# Patient Record
Sex: Female | Born: 1995 | Race: Black or African American | Hispanic: No | Marital: Single | Smoking: Never smoker
Health system: Southern US, Community
[De-identification: ages and names within clinical notes are randomized; demographics above are authoritative.]

---
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
## 2016-05-03 ENCOUNTER — Emergency Department (HOSPITAL_COMMUNITY)
Admission: EM | Admit: 2016-05-03 | Discharge: 2016-05-03 | Disposition: A | Discharge status: Home / Self Care | Payer: No Typology Code available for payment source | Attending: Emergency Medicine | Admitting: Emergency Medicine

## 2016-05-03 ENCOUNTER — Encounter (HOSPITAL_COMMUNITY): Payer: Self-pay | Admitting: *Deleted

## 2016-05-03 DIAGNOSIS — R05 Cough: Secondary | ICD-10-CM | POA: Diagnosis present

## 2016-05-03 DIAGNOSIS — J069 Acute upper respiratory infection, unspecified: Secondary | ICD-10-CM | POA: Insufficient documentation

## 2016-05-03 LAB — RAPID STREP SCREEN (MED CTR MEBANE ONLY): Streptococcus, Group A Screen (Direct): NEGATIVE

## 2016-05-03 NOTE — ED Provider Notes (Signed)
[facility] Provider Note   CSN: [id number] Arrival date & time: 05/03/16  2113     History   Chief Complaint Chief Complaint  Patient presents with  . Cough  . Sore Throat  . Chest Pain    HPI Kelly Lynn is a 21 y.o. female.  21 year old female presents with 48 hour history of URI symptoms consisting of sore throat, chest congestion, subjective myalgias. Denies any vomiting or diarrhea. No headache or neck pain. No photophobia. No rashes noted. Notes positive sick exposures. Has used of the counter medications without relief.      History reviewed. No pertinent past medical history.  There are no active problems to display for this patient.   History reviewed. No pertinent surgical history.  OB History    No data available       Home Medications    Prior to Admission medications   Medication Sig Start Date End Date Taking? Authorizing Provider  ferrous sulfate 324 (65 Fe) MG TBEC Take 1 tablet (324 mg total) by mouth daily. 03/29/15  Yes Raliegh Ip, DO    Family History No family history on file.  Social History Social History  Substance Use Topics  . Smoking status: Never Smoker  . Smokeless tobacco: Never Used  . Alcohol use No     Allergies   Patient has no known allergies.   Review of Systems Review of Systems  All other systems reviewed and are negative.    Physical Exam Updated Vital Signs BP 110/79 (BP Location: Left Arm)   Pulse (!) 105   Temp 98.6 F (37 C) (Oral)   Resp 16   SpO2 100%   Physical Exam  Constitutional: She is oriented to person, place, and time. She appears well-developed and well-nourished.  Non-toxic appearance. No distress.  HENT:  Head: Normocephalic and atraumatic.  Eyes: Conjunctivae, EOM and lids are normal. Pupils are equal, round, and reactive to light.  Neck: Normal range of motion. Neck supple. No tracheal deviation present. No thyroid mass present.  Cardiovascular: Normal rate,  regular rhythm and normal heart sounds.  Exam reveals no gallop.   No murmur heard. Pulmonary/Chest: Effort normal and breath sounds normal. No stridor. No respiratory distress. She has no decreased breath sounds. She has no wheezes. She has no rhonchi. She has no rales.  Abdominal: Soft. Normal appearance and bowel sounds are normal. She exhibits no distension. There is no tenderness. There is no rebound and no CVA tenderness.  Musculoskeletal: Normal range of motion. She exhibits no edema or tenderness.  Neurological: She is alert and oriented to person, place, and time. She has normal strength. No cranial nerve deficit or sensory deficit. GCS eye subscore is 4. GCS verbal subscore is 5. GCS motor subscore is 6.  Skin: Skin is warm and dry. No abrasion and no rash noted.  Psychiatric: She has a normal mood and affect. Her speech is normal and behavior is normal.  Nursing note and vitals reviewed.    ED Treatments / Results  Labs (all labs ordered are listed, but only abnormal results are displayed) Labs Reviewed  RAPID STREP SCREEN (NOT AT [facility])  CULTURE, GROUP A STREP [facility])    EKG  EKG Interpretation  Date/Time:  Thursday May 03 2016 21:38:24 EDT Ventricular Rate:  101 PR Interval:    QRS Duration: 88 QT Interval:  338 QTC Calculation: 439 R Axis:   22 Text Interpretation:  Age not entered, assumed to be  21 years  old for purpose of ECG interpretation Sinus tachycardia Abnormal R-wave progression, early transition Confirmed by Potts  MD, Gwynneth Fabio ([id number]) on 05/03/2016 10:31:23 PM       Radiology No results found.  Procedures Procedures (including critical care time)  Medications Ordered in ED Medications - No data to display   Initial Impression / Assessment and Plan / ED Course  I have reviewed the triage vital signs and the nursing notes.  Pertinent labs & imaging results that were available during my care of the patient were reviewed by me and considered in my  medical decision making (see chart for details).     Strep test is negative. Patient likely URI. Stable for discharge  Final Clinical Impressions(s) / ED Diagnoses   Final diagnoses:  None    New Prescriptions New Prescriptions   No medications on file     Lorre NickAnthony Lunt, MD 05/03/16 2241

## 2016-05-03 NOTE — ED Triage Notes (Signed)
Pt complains of sore throat, cough, and chest pain for the past couple of days. Pt states the pain is worse with cough and talking. Pt states she noticed some blood in her sputum this morning.

## 2016-05-06 LAB — CULTURE, GROUP A STREP (THRC)

## 2016-12-17 ENCOUNTER — Ambulatory Visit (INDEPENDENT_AMBULATORY_CARE_PROVIDER_SITE_OTHER): Payer: Self-pay

## 2016-12-17 ENCOUNTER — Ambulatory Visit (HOSPITAL_COMMUNITY)
Admission: EM | Admit: 2016-12-17 | Discharge: 2016-12-17 | Disposition: A | Discharge status: Home / Self Care | Payer: Self-pay | Attending: Emergency Medicine | Admitting: Emergency Medicine

## 2016-12-17 ENCOUNTER — Encounter (HOSPITAL_COMMUNITY): Payer: Self-pay | Admitting: Emergency Medicine

## 2016-12-17 DIAGNOSIS — S63501A Unspecified sprain of right wrist, initial encounter: Secondary | ICD-10-CM

## 2016-12-17 DIAGNOSIS — M25531 Pain in right wrist: Secondary | ICD-10-CM

## 2016-12-17 NOTE — ED Provider Notes (Signed)
[facility]    CSN: [id number] Arrival date & time: 12/17/16  1620     History   Chief Complaint Chief Complaint  Patient presents with  . Wrist Pain    HPI Kelly Lynn is a 21 y.o. female.   21 year-old female, presenting today complaining of right wrist pain. She states that she was opening a door a few days ago with a flexed wrist and and has had pain to the radial aspect of the right wrist since that time with some mild swelling. Patient is right handed. She has been using ice and motrin at home with mild relief    The history is provided by the patient.  Wrist Pain  This is a new problem. The current episode started more than 2 days ago. The problem occurs constantly. The problem has not changed since onset.Pertinent negatives include no chest pain, no abdominal pain, no headaches and no shortness of breath. Exacerbated by: flexion of the wrist  The symptoms are relieved by ice and NSAIDs. She has tried a cold compress for the symptoms. The treatment provided mild relief.    History reviewed. No pertinent past medical history.  There are no active problems to display for this patient.   History reviewed. No pertinent surgical history.  OB History    No data available       Home Medications    Prior to Admission medications   Medication Sig Start Date End Date Taking? Authorizing Provider  ferrous sulfate 324 (65 Fe) MG TBEC Take 1 tablet (324 mg total) by mouth daily. 03/29/15   Raliegh Ip, DO    Family History History reviewed. No pertinent family history.  Social History Social History  Substance Use Topics  . Smoking status: Never Smoker  . Smokeless tobacco: Never Used  . Alcohol use No     Allergies   Patient has no known allergies.   Review of Systems Review of Systems  Constitutional: Negative for chills and fever.  HENT: Negative for ear pain and sore throat.   Eyes: Negative for pain and visual disturbance.    Respiratory: Negative for cough and shortness of breath.   Cardiovascular: Negative for chest pain and palpitations.  Gastrointestinal: Negative for abdominal pain and vomiting.  Genitourinary: Negative for dysuria and hematuria.  Musculoskeletal: Positive for arthralgias (right wrist ) and joint swelling. Negative for back pain.  Skin: Negative for color change and rash.  Neurological: Negative for seizures, syncope and headaches.  All other systems reviewed and are negative.    Physical Exam Triage Vital Signs ED Triage Vitals [12/17/16 1710]  Enc Vitals Group     BP 127/62     Pulse Rate 82     Resp 18     Temp 98.4 F (36.9 C)     Temp Source Oral     SpO2 100 %     Weight      Height      Head Circumference      Peak Flow      Pain Score 7     Pain Loc      Pain Edu?      Excl. in GC?    No data found.   Updated Vital Signs BP 127/62 (BP Location: Right Arm)   Pulse 82   Temp 98.4 F (36.9 C) (Oral)   Resp 18   SpO2 100%   Visual Acuity Right Eye Distance:   Left Eye Distance:  Bilateral Distance:    Right Eye Near:   Left Eye Near:    Bilateral Near:     Physical Exam  Constitutional: She appears well-developed and well-nourished. No distress.  HENT:  Head: Normocephalic and atraumatic.  Eyes: Conjunctivae are normal.  Neck: Neck supple.  Cardiovascular: Normal rate and regular rhythm.   No murmur heard. Pulmonary/Chest: Effort normal and breath sounds normal. No respiratory distress.  Abdominal: Soft. There is no tenderness.  Musculoskeletal: She exhibits no edema.       Right wrist: She exhibits tenderness and swelling.  Tenderness to palpation of the right wrist with mild amount of swelling noted to the radial aspect of the wrist.   Neurological: She is alert.  Skin: Skin is warm and dry.  Psychiatric: She has a normal mood and affect.  Nursing note and vitals reviewed.    UC Treatments / Results  Labs (all labs ordered are listed,  but only abnormal results are displayed) Labs Reviewed - No data to display  EKG  EKG Interpretation None       Radiology Dg Wrist Complete Right  Result Date: 12/17/2016 CLINICAL DATA:  Patient states that she hyperextended her right wrist when she fell two days ago, pain. EXAM: RIGHT WRIST - COMPLETE 3+ VIEW COMPARISON:  None. FINDINGS: No distal radius or ulnar fracture. Radiocarpal joint is intact. No carpal fracture. No soft tissue abnormality. IMPRESSION: No fracture or dislocation. Electronically Signed   By: Genevive BiStewart  Edmunds M.D.   On: 12/17/2016 18:17    Procedures Procedures (including critical care time)  Medications Ordered in UC Medications - No data to display   Initial Impression / Assessment and Plan / UC Course  I have reviewed the triage vital signs and the nursing notes.  Pertinent labs & imaging results that were available during my care of the patient were reviewed by me and considered in my medical decision making (see chart for details).     Right wrist injury . Normal XR. Will treat for sprain wit RICE and wrist splint   Final Clinical Impressions(s) / UC Diagnoses   Final diagnoses:  Sprain of right wrist, initial encounter    New Prescriptions Discharge Medication List as of 12/17/2016  6:28 PM       Controlled Substance Prescriptions [place] Controlled Substance Registry consulted? Not Applicable   Alecia LemmingBlue, Olivia C, [place] 12/17/16 [id number]

## 2016-12-17 NOTE — ED Triage Notes (Signed)
Pt sts right wrist pain after pushing open heavy door yesterday

## 2019-01-29 IMAGING — DX DG WRIST COMPLETE 3+V*R*
4 series · 4 of 4 positions shown · non-contrast
Comparison: None.

CLINICAL DATA: Patient states that she hyperextended her right
wrist when she fell two days ago, pain.

EXAM:
RIGHT WRIST - COMPLETE 3+ VIEW

[wrist pa]
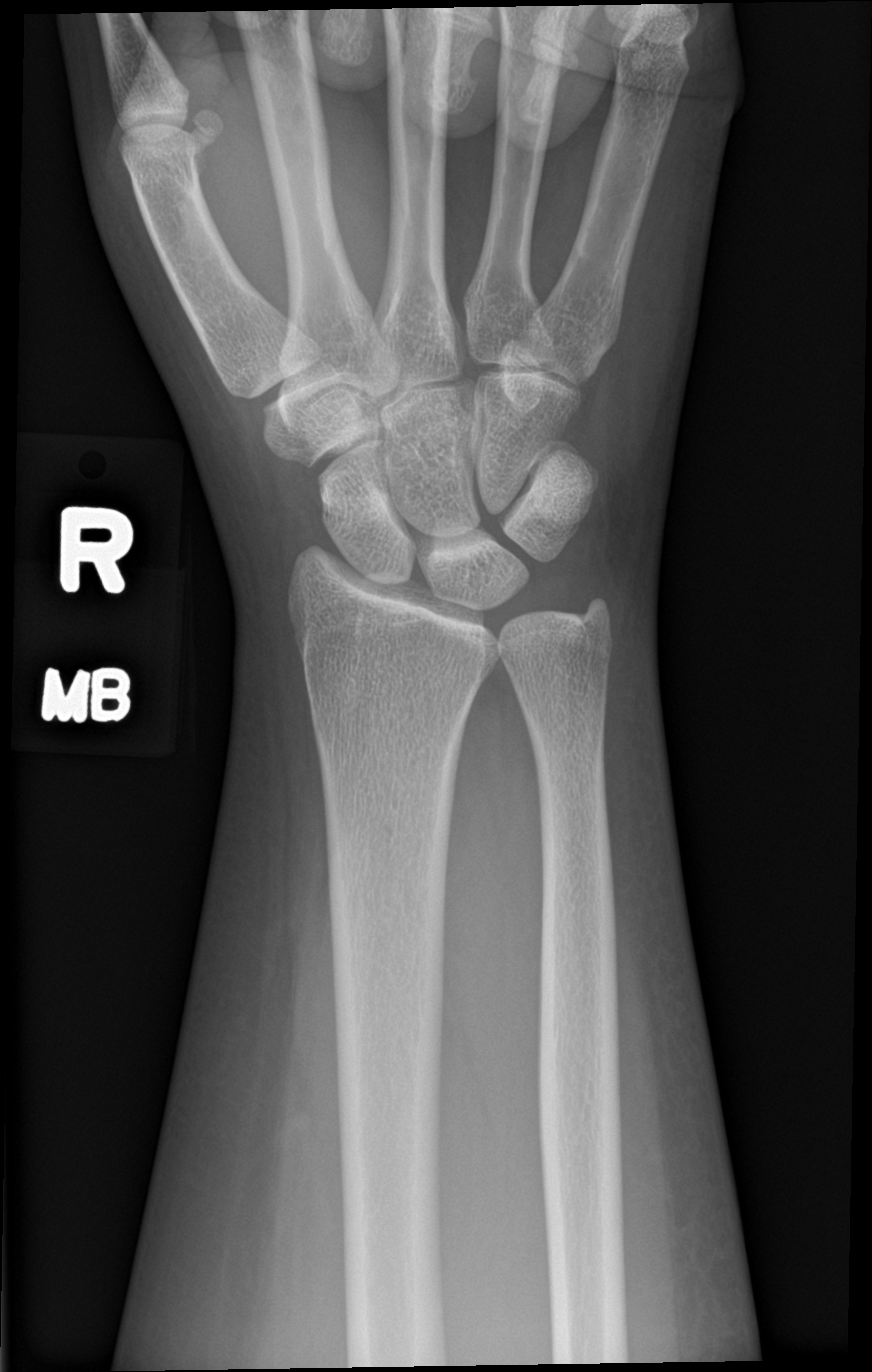

[wrist navicular]
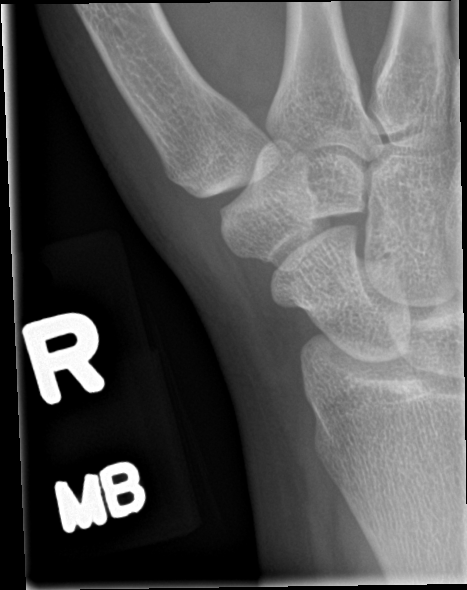

[wrist obl]
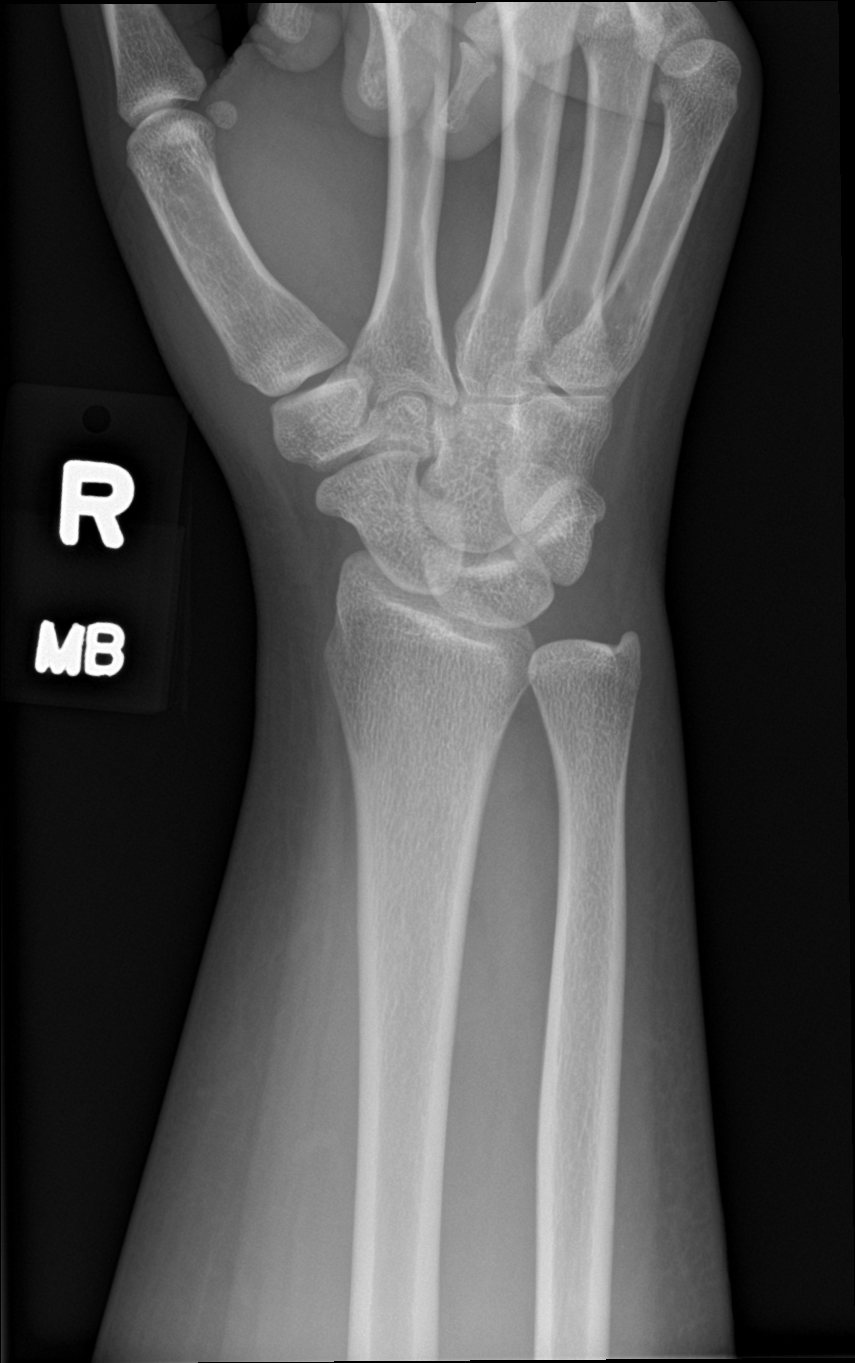

[wrist lat]
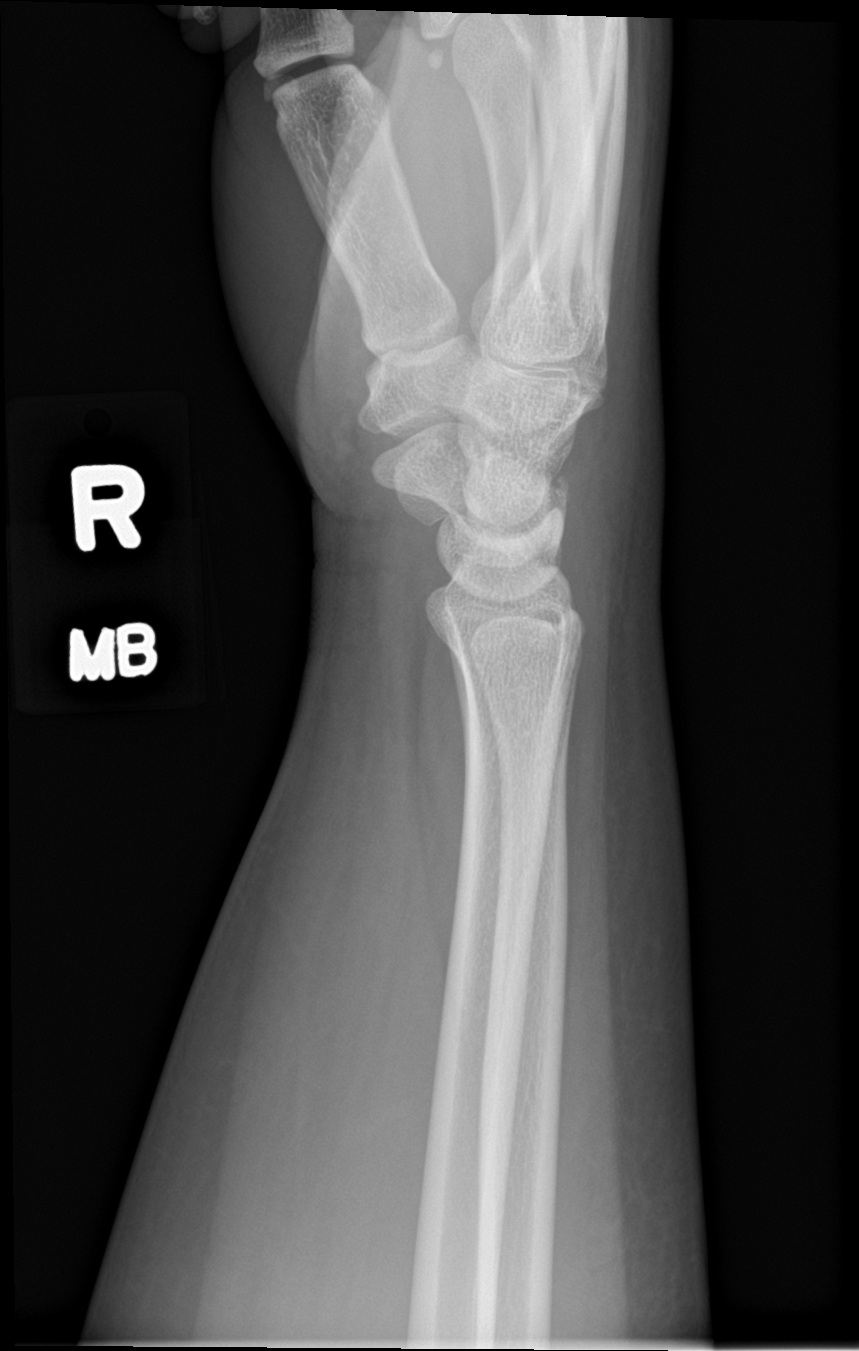

[4 of 4 positions shown; findings below may reference images not displayed]

FINDINGS: No distal radius or ulnar fracture. Radiocarpal joint is intact. No
carpal fracture. No soft tissue abnormality.
IMPRESSION: No fracture or dislocation.
